# Patient Record
Sex: Male | Born: 1960 | Race: Black or African American | Hispanic: No | Marital: Single | Smoking: Current every day smoker
Health system: Southern US, Community
[De-identification: ages and names within clinical notes are randomized; demographics above are authoritative.]

## PROBLEM LIST (undated history)

## (undated) DIAGNOSIS — E119 Type 2 diabetes mellitus without complications: Secondary | ICD-10-CM

## (undated) DIAGNOSIS — M109 Gout, unspecified: Secondary | ICD-10-CM

## (undated) DIAGNOSIS — M1990 Unspecified osteoarthritis, unspecified site: Secondary | ICD-10-CM

## (undated) DIAGNOSIS — I10 Essential (primary) hypertension: Secondary | ICD-10-CM

---

## 2010-05-05 ENCOUNTER — Emergency Department (HOSPITAL_BASED_OUTPATIENT_CLINIC_OR_DEPARTMENT_OTHER): Admission: EM | Admit: 2010-05-05 | Discharge: 2010-05-05 | Payer: Self-pay | Admitting: Emergency Medicine

## 2010-05-05 ENCOUNTER — Ambulatory Visit: Payer: Self-pay | Admitting: Diagnostic Radiology

## 2012-04-10 IMAGING — CR DG SHOULDER 2+V*L*
3 series · 3 of 3 positions shown · non-contrast
Comparison: None

CLINICAL DATA: Shoulder injury with pain

LEFT SHOULDER - 2+ VIEW

[w shoulder ap internal left]
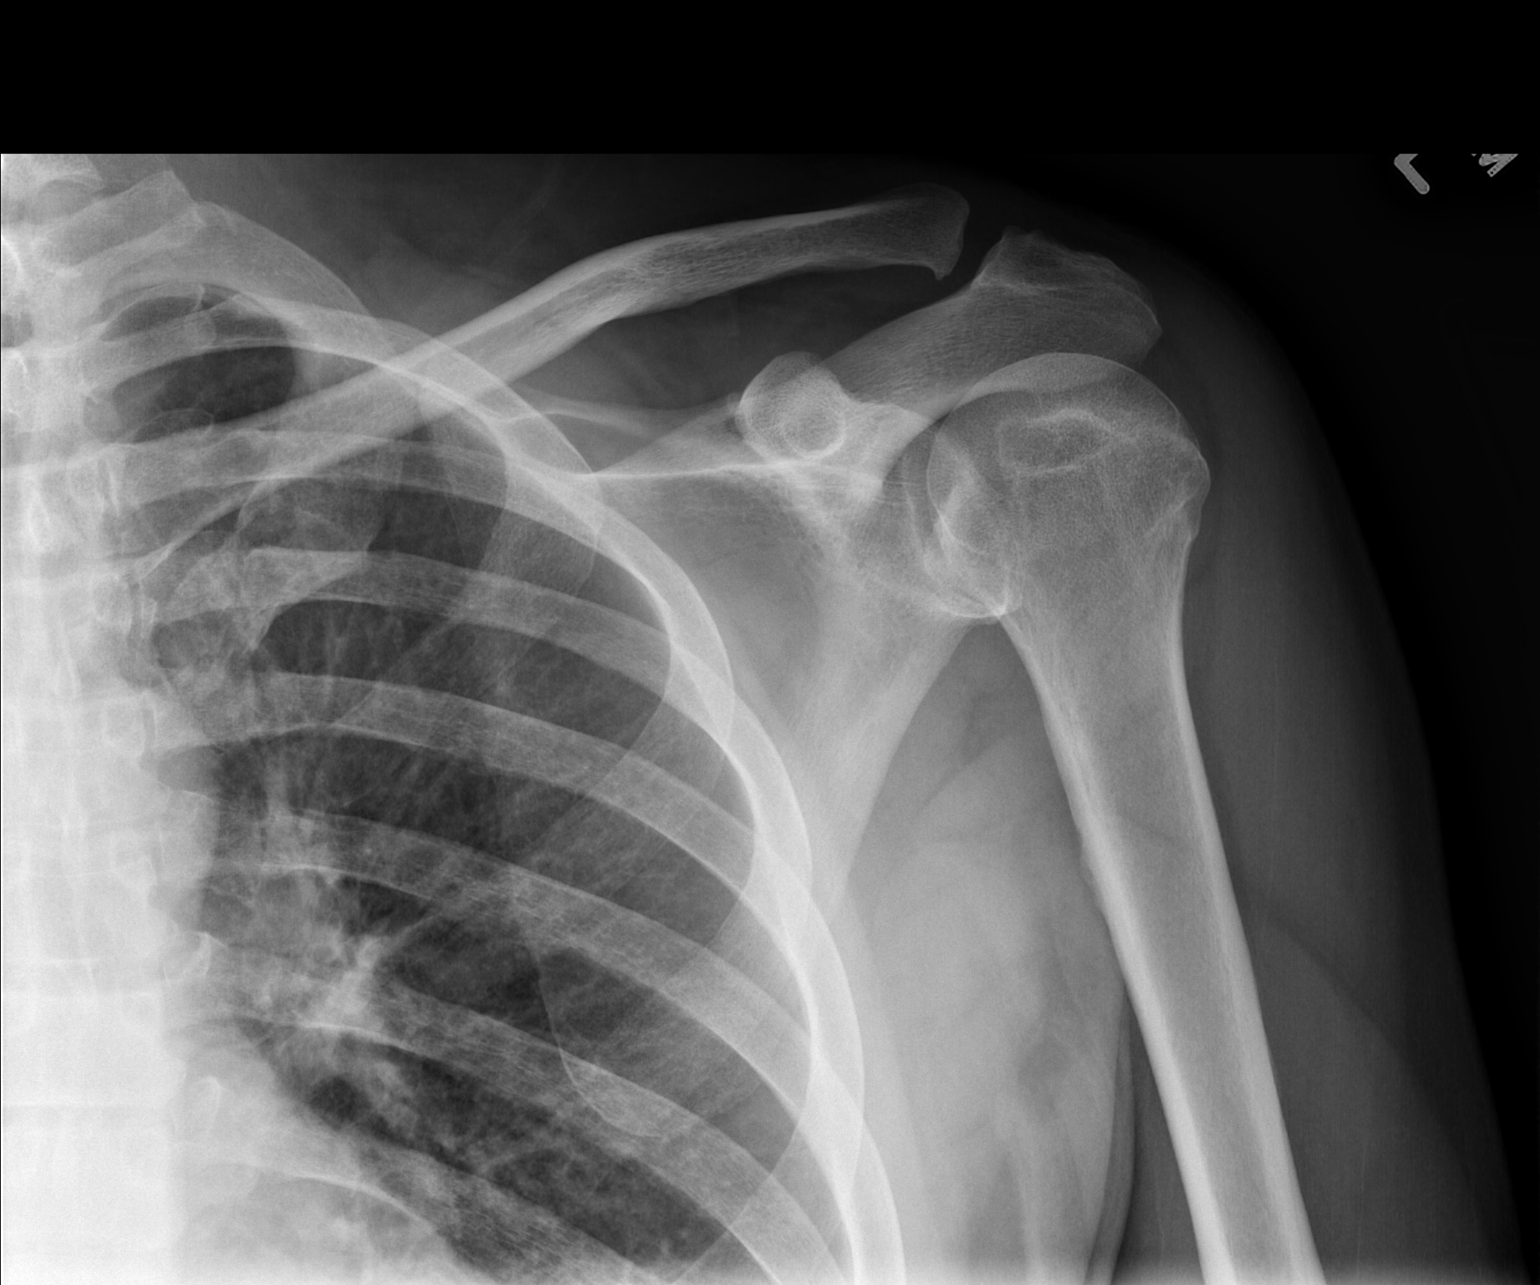

[w shoulder ap external left]
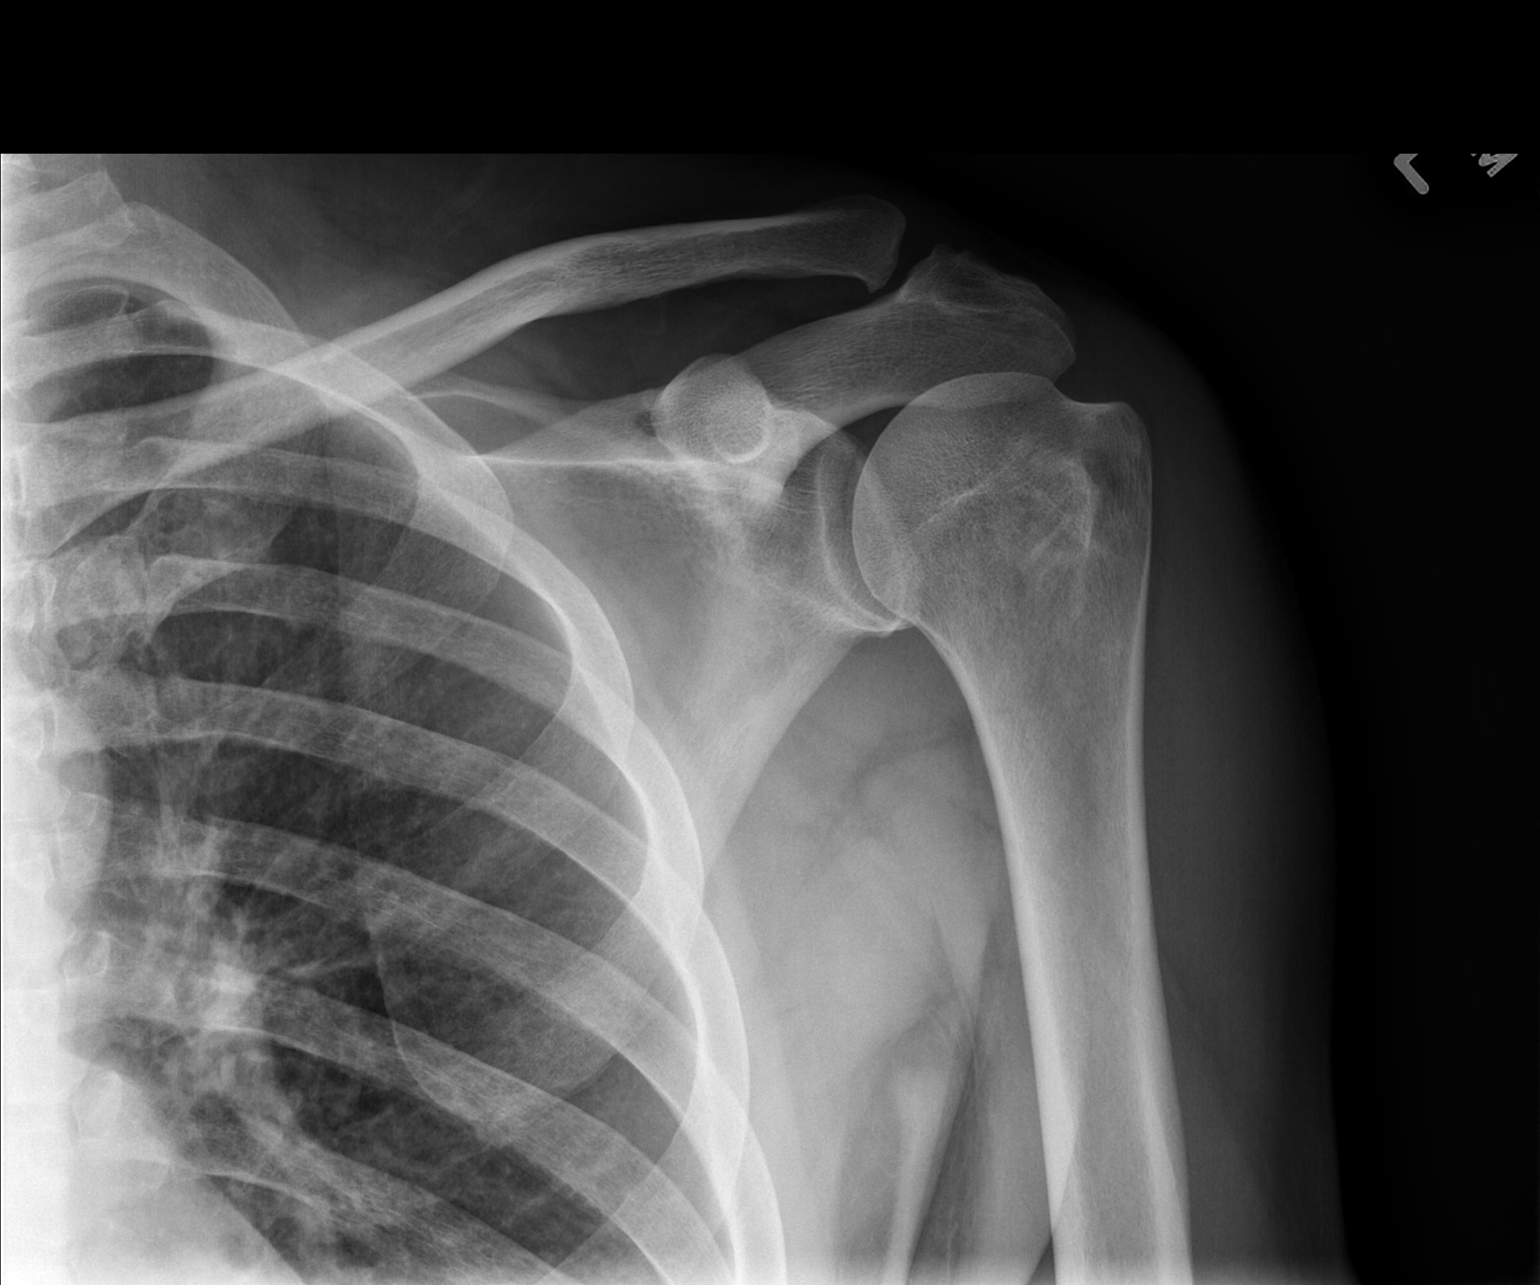

[w shoulder y view left]
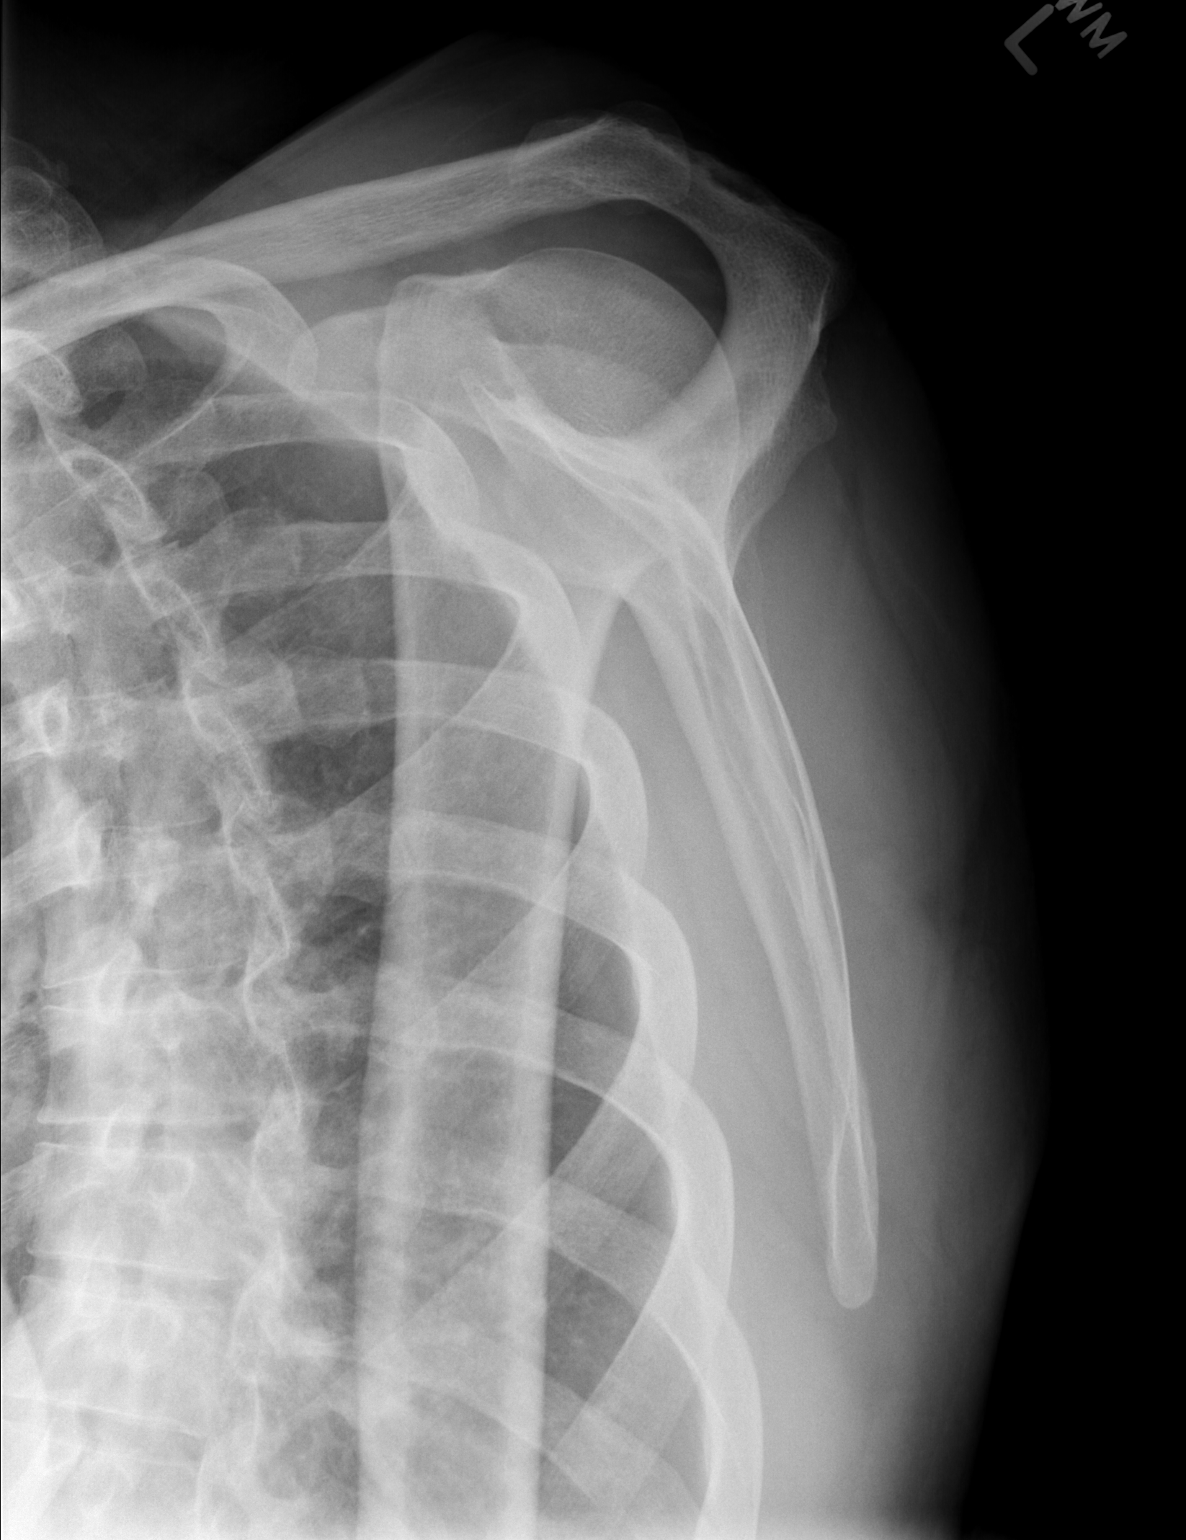

[3 of 3 positions shown; findings below may reference images not displayed]

FINDINGS: Normal alignment.  Negative for fracture.  Mild
degenerative change and spurring in the AC joint.  Glenohumeral
joint appears normal.
IMPRESSION: No acute bony abnormality.

## 2015-08-18 ENCOUNTER — Emergency Department (HOSPITAL_BASED_OUTPATIENT_CLINIC_OR_DEPARTMENT_OTHER)
Admission: EM | Admit: 2015-08-18 | Discharge: 2015-08-18 | Disposition: A | Discharge status: Home / Self Care | Payer: Medicare HMO | Attending: Emergency Medicine | Admitting: Emergency Medicine

## 2015-08-18 ENCOUNTER — Emergency Department (HOSPITAL_BASED_OUTPATIENT_CLINIC_OR_DEPARTMENT_OTHER): Payer: Medicare HMO

## 2015-08-18 ENCOUNTER — Encounter (HOSPITAL_BASED_OUTPATIENT_CLINIC_OR_DEPARTMENT_OTHER): Payer: Self-pay | Admitting: *Deleted

## 2015-08-18 DIAGNOSIS — R112 Nausea with vomiting, unspecified: Secondary | ICD-10-CM | POA: Insufficient documentation

## 2015-08-18 DIAGNOSIS — E119 Type 2 diabetes mellitus without complications: Secondary | ICD-10-CM | POA: Diagnosis not present

## 2015-08-18 DIAGNOSIS — I10 Essential (primary) hypertension: Secondary | ICD-10-CM | POA: Diagnosis not present

## 2015-08-18 DIAGNOSIS — M109 Gout, unspecified: Secondary | ICD-10-CM | POA: Diagnosis not present

## 2015-08-18 DIAGNOSIS — F17210 Nicotine dependence, cigarettes, uncomplicated: Secondary | ICD-10-CM | POA: Insufficient documentation

## 2015-08-18 DIAGNOSIS — G8929 Other chronic pain: Secondary | ICD-10-CM | POA: Diagnosis not present

## 2015-08-18 DIAGNOSIS — R51 Headache: Secondary | ICD-10-CM | POA: Diagnosis present

## 2015-08-18 DIAGNOSIS — M1990 Unspecified osteoarthritis, unspecified site: Secondary | ICD-10-CM | POA: Diagnosis not present

## 2015-08-18 DIAGNOSIS — H53149 Visual discomfort, unspecified: Secondary | ICD-10-CM | POA: Insufficient documentation

## 2015-08-18 DIAGNOSIS — R519 Headache, unspecified: Secondary | ICD-10-CM

## 2015-08-18 DIAGNOSIS — Z79899 Other long term (current) drug therapy: Secondary | ICD-10-CM | POA: Diagnosis not present

## 2015-08-18 HISTORY — DX: Type 2 diabetes mellitus without complications: E11.9

## 2015-08-18 HISTORY — DX: Gout, unspecified: M10.9

## 2015-08-18 HISTORY — DX: Essential (primary) hypertension: I10

## 2015-08-18 HISTORY — DX: Unspecified osteoarthritis, unspecified site: M19.90

## 2015-08-18 LAB — CBC WITH DIFFERENTIAL/PLATELET
Basophils Absolute: 0 10*3/uL (ref 0.0–0.1)
Basophils Relative: 0 %
Eosinophils Absolute: 0 10*3/uL (ref 0.0–0.7)
Eosinophils Relative: 0 %
HEMATOCRIT: 47.2 % (ref 39.0–52.0)
HEMOGLOBIN: 15.7 g/dL (ref 13.0–17.0)
LYMPHS ABS: 1.1 10*3/uL (ref 0.7–4.0)
LYMPHS PCT: 11 %
MCH: 27.7 pg (ref 26.0–34.0)
MCHC: 33.3 g/dL (ref 30.0–36.0)
MCV: 83.4 fL (ref 78.0–100.0)
MONOS PCT: 3 %
Monocytes Absolute: 0.4 10*3/uL (ref 0.1–1.0)
NEUTROS PCT: 86 %
Neutro Abs: 9.3 10*3/uL — ABNORMAL HIGH (ref 1.7–7.7)
Platelets: 183 10*3/uL (ref 150–400)
RBC: 5.66 MIL/uL (ref 4.22–5.81)
RDW: 14.4 % (ref 11.5–15.5)
WBC: 10.8 10*3/uL — ABNORMAL HIGH (ref 4.0–10.5)

## 2015-08-18 LAB — BASIC METABOLIC PANEL
Anion gap: 9 (ref 5–15)
BUN: 9 mg/dL (ref 6–20)
CHLORIDE: 103 mmol/L (ref 101–111)
CO2: 26 mmol/L (ref 22–32)
CREATININE: 1.11 mg/dL (ref 0.61–1.24)
Calcium: 9 mg/dL (ref 8.9–10.3)
GFR calc Af Amer: >60 mL/min (ref >60)
GFR calc non Af Amer: >60 mL/min (ref >60)
GLUCOSE: 165 mg/dL — AB (ref 65–99)
POTASSIUM: 4 mmol/L (ref 3.5–5.1)
Sodium: 138 mmol/L (ref 135–145)

## 2015-08-18 LAB — CBG MONITORING, ED: GLUCOSE-CAPILLARY: 167 mg/dL — AB (ref 65–99)

## 2015-08-18 MED ORDER — SODIUM CHLORIDE 0.9 % IV BOLUS (SEPSIS)
250.0000 mL | Freq: Once | INTRAVENOUS | Status: AC
  Administered 2015-08-18: 250 mL via INTRAVENOUS

## 2015-08-18 MED ORDER — HYDROCODONE-ACETAMINOPHEN 5-325 MG PO TABS: 1.0000 | ORAL_TABLET | Freq: Four times a day (QID) | ORAL | Status: AC | PRN

## 2015-08-18 MED ORDER — ONDANSETRON HCL 4 MG/2ML IJ SOLN
4.0000 mg | Freq: Once | INTRAMUSCULAR | Status: AC
  Administered 2015-08-18: 4 mg via INTRAVENOUS
  Filled 2015-08-18: qty 2

## 2015-08-18 MED ORDER — SODIUM CHLORIDE 0.9 % IV SOLN
INTRAVENOUS | Status: DC
  Administered 2015-08-18: 11:00:00 via INTRAVENOUS

## 2015-08-18 MED ORDER — FENTANYL CITRATE (PF) 100 MCG/2ML IJ SOLN
50.0000 ug | Freq: Once | INTRAMUSCULAR | Status: AC
  Administered 2015-08-18: 50 ug via INTRAVENOUS
  Filled 2015-08-18: qty 2

## 2015-08-18 MED ORDER — HYDROMORPHONE HCL 1 MG/ML IJ SOLN
1.0000 mg | Freq: Once | INTRAMUSCULAR | Status: AC
  Administered 2015-08-18: 1 mg via INTRAVENOUS
  Filled 2015-08-18: qty 1

## 2015-08-18 MED FILL — HYDROCODON-APAP 5-325: 5-325 | 2 days supply | Qty: 10 | Fill #0

## 2015-08-18 NOTE — ED Notes (Signed)
C/o h/a since Friday in top of head and behind right eye. Vomited x 1 today. C/o runny nose and cough with clear sputum.

## 2015-08-18 NOTE — ED Notes (Signed)
Pt is slow to answer questions, speech is normal. PT states he can usually answer questions easier.

## 2015-08-18 NOTE — ED Provider Notes (Signed)
CSN: [id number]     Arrival date & time 08/18/15  4098 History   First MD Initiated Contact with Patient 08/18/15 1020     No chief complaint on file.    (Consider location/radiation/quality/duration/timing/severity/associated sxs/prior Treatment) The history is provided by the patient.   55 year old male with complaint of headache for 5 days. No worse no better. Associated with pain to the top of the head and slight pain behind the right eye. Associated with nausea and vomiting 1 today. No fever. Also associated with slight photophobia. No neck stiffness. No abdominal pain no chest pain no shortness of breath no unusual rash. Patient without history of chronic headaches or headache similar to this. Patient without history of migraines. Patient states she's had a little bit of increased nasal congestion but he felt that was due to the cold temperatures does not feel like he has an upper respiratory infection. Patient does not have any sinus pressure complaints. Patient states that the pain is 6 out of 10.  Past Medical History  Diagnosis Date  . Diabetes mellitus without complication (HCC)   . Hypertension   . Arthritis   . Gout of ankle    History reviewed. No pertinent past surgical history. No family history on file. Social History  Substance Use Topics  . Smoking status: Current Every Day Smoker -- 0.50 packs/day    Types: Cigarettes  . Smokeless tobacco: None  . Alcohol Use: None    Review of Systems  Constitutional: Negative for fever.  HENT: Negative for congestion and sinus pressure.   Eyes: Positive for photophobia and pain. Negative for visual disturbance.  Respiratory: Negative for shortness of breath.   Cardiovascular: Negative for chest pain.  Gastrointestinal: Positive for nausea and vomiting. Negative for abdominal pain.  Genitourinary: Negative for dysuria.  Musculoskeletal: Negative for back pain, neck pain and neck stiffness.  Skin: Negative for rash.   Neurological: Positive for headaches.  Hematological: Does not bruise/bleed easily.  Psychiatric/Behavioral: Negative for confusion.      Allergies  Review of patient's allergies indicates no known allergies.  Home Medications   Prior to Admission medications   Medication Sig Start Date End Date Taking? Authorizing Provider  allopurinol (ZYLOPRIM) 100 MG tablet Take 300 mg by mouth daily.   Yes Historical Provider, MD  lisinopril (PRINIVIL,ZESTRIL) 10 MG tablet Take 10 mg by mouth daily.   Yes Historical Provider, MD  rosuvastatin (CRESTOR) 10 MG tablet Take 10 mg by mouth daily.   Yes Historical Provider, MD  HYDROcodone-acetaminophen (NORCO/VICODIN) 5-325 MG tablet Take 1-2 tablets by mouth every 6 (six) hours as needed for moderate pain. 08/18/15   Vanetta Mulders, MD   BP 138/76 mmHg  Pulse 70  Temp(Src) 98.9 F (37.2 C) (Oral)  Resp 16  Ht 6\' 1"  (1.854 m)  Wt 92.08 kg  BMI 26.79 kg/m2  SpO2 97% Physical Exam  Constitutional: He is oriented to person, place, and time. He appears well-developed and well-nourished. No distress.  HENT:  Head: Normocephalic and atraumatic.  Mouth/Throat: Oropharynx is clear and moist.  Eyes: Conjunctivae and EOM are normal. Pupils are equal, round, and reactive to light.  Neck: Normal range of motion. Neck supple.  Cardiovascular: Normal rate, regular rhythm and normal heart sounds.   No murmur heard. Pulmonary/Chest: Effort normal and breath sounds normal. No respiratory distress.  Abdominal: Soft. Bowel sounds are normal. There is no tenderness.  Musculoskeletal: Normal range of motion.  Neurological: He is alert and oriented to person, place,  and time. No cranial nerve deficit. He exhibits normal muscle tone. Coordination normal.  Skin: Skin is warm. No rash noted.  Nursing note and vitals reviewed.   ED Course  Procedures (including critical care time) Labs Review Labs Reviewed  CBC WITH DIFFERENTIAL/PLATELET - Abnormal; Notable  for the following:    WBC 10.8 (*)    Neutro Abs 9.3 (*)    All other components within normal limits  BASIC METABOLIC PANEL - Abnormal; Notable for the following:    Glucose, Bld 165 (*)    All other components within normal limits  CBG MONITORING, ED - Abnormal; Notable for the following:    Glucose-Capillary 167 (*)    All other components within normal limits    Imaging Review Ct Head Wo Contrast  08/18/2015  CLINICAL DATA:  Headache since Friday.  Smoker. EXAM: CT HEAD WITHOUT CONTRAST TECHNIQUE: Contiguous axial images were obtained from the base of the skull through the vertex without intravenous contrast. COMPARISON:  None. FINDINGS: There is no evidence of mass effect, midline shift, or extra-axial fluid collections. There is no evidence of a space-occupying lesion or intracranial hemorrhage. There is no evidence of a cortical-based area of acute infarction. 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 There is generalized cerebral atrophy. There is periventricular white matter low attenuation likely secondary to microangiopathy. The ventricles and sulci are appropriate for the patient's age. The basal cisterns are patent. Visualized portions of the orbits are unremarkable. The visualized portions of the paranasal sinuses and mastoid air cells are unremarkable. The osseous structures are unremarkable. IMPRESSION: No acute intracranial pathology. Electronically Signed   By: Elige KoHetal  Patel   On: 08/18/2015 10:52   I have personally reviewed and evaluated these images and lab results as part of my medical decision-making.   EKG Interpretation None      MDM   Final diagnoses:  Acute intractable headache, unspecified headache type    Lab workup without any acute findings. Head CT negative. No evidence of any sinus infection. No evidence of any head bleed. Patient's headache improved here with pain medicine. If symptoms persist MRI of brain would be appropriate. Patient  currently does not have a primary care doctor resource guide provided to help him find a primary care provider. Patient had only slight pain behind the right eye no actual eyeball pain. Do not think this is consistent with acute glaucoma. Patient really without upper respiratory infection symptoms. Several low bit more of a runny nose but he felt that that was do to the cold temperatures with it having of late and not anything terribly unusual.  Patient without history of chronic headaches or migraines. Clinically do not feel is consistent with meningitis.  Vanetta MuldersScott Enio Hornback, MD 08/18/15 1315

## 2015-08-18 NOTE — ED Notes (Signed)
MD at bedside. 

## 2015-08-18 NOTE — ED Notes (Signed)
Nurse first witnessed pt leave ed with another adult male who was driving.

## 2015-08-18 NOTE — ED Notes (Signed)
Pt states his brother is coming to pick him up, but he does not know when he will be here. Pt asked to call again, states he is getting only a busy signal.

## 2015-08-18 NOTE — Discharge Instructions (Signed)
General Headache Without Cause A headache is pain or discomfort felt around the head or neck area. There are many causes and types of headaches. In some cases, the cause may not be found.  HOME CARE  Managing Pain  Take over-the-counter and prescription medicines only as told by your doctor.  Lie down in a dark, quiet room when you have a headache.  If directed, apply ice to the head and neck area:  Put ice in a plastic bag.  Place a towel between your skin and the bag.  Leave the ice on for 20 minutes, 2-3 times per day.  Use a heating pad or hot shower to apply heat to the head and neck area as told by your doctor.  Keep lights dim if bright lights bother you or make your headaches worse. Eating and Drinking  Eat meals on a regular schedule.  Lessen how much alcohol you drink.  Lessen how much caffeine you drink, or stop drinking caffeine. General Instructions  Keep all follow-up visits as told by your doctor. This is important.  Keep a journal to find out if certain things bring on headaches. For example, write down:  What you eat and drink.  How much sleep you get.  Any change to your diet or medicines.  Relax by getting a massage or doing other relaxing activities.  Lessen stress.  Sit up straight. Do not tighten (tense) your muscles.  Do not use tobacco products. This includes cigarettes, chewing tobacco, or e-cigarettes. If you need help quitting, ask your doctor.  Exercise regularly as told by your doctor.  Get enough sleep. This often means 7-9 hours of sleep. GET HELP IF:  Your symptoms are not helped by medicine.  You have a headache that feels different than the other headaches.  You feel sick to your stomach (nauseous) or you throw up (vomit).  You have a fever. GET HELP RIGHT AWAY IF:   Your headache becomes really bad.  You keep throwing up.  You have a stiff neck.  You have trouble seeing.  You have trouble speaking.  You have  pain in the eye or ear.  Your muscles are weak or you lose muscle control.  You lose your balance or have trouble walking.  You feel like you will pass out (faint) or you pass out.  You have confusion.   This information is not intended to replace advice given to you by your health care provider. Make sure you discuss any questions you have with your health care provider.  Resource guide provided below. If headache persists would recommend MRI of the brain for further evaluation. Today's evaluation without any acute findings. Take pain medicine as directed.        Emergency Department Resource Guide 1) Find a Doctor and Pay Out of Pocket Although you won't have to find out who is covered by your insurance plan, it is a good idea to ask around and get recommendations. You will then need to call the office and see if the doctor you have chosen will accept you as a new patient and what types of options they offer for patients who are self-pay. Some doctors offer discounts or will set up payment plans for their patients who do not have insurance, but you will need to ask so you aren't surprised when you get to your appointment.  2) Contact Your Local Health Department Not all health departments have doctors that can see patients for sick visits, but many  do, so it is worth a call to see if yours does. If you don't know where your local health department is, you can check in your phone book. The CDC also has a tool to help you locate your state's health department, and many state websites also have listings of all of their local health departments.  3) Find a Walk-in Clinic If your illness is not likely to be very severe or complicated, you may want to try a walk in clinic. These are popping up all over the country in pharmacies, drugstores, and shopping centers. They're usually staffed by nurse practitioners or physician assistants that have been trained to treat common illnesses and  complaints. They're usually fairly quick and inexpensive. However, if you have serious medical issues or chronic medical problems, these are probably not your best option.  No Primary Care Doctor: - Call Health Connect at  [phone] - they can help you locate a primary care doctor that  accepts your insurance, provides certain services, etc. - Physician Referral Service- [phone]  Chronic Pain Problems: Organization         Address  Phone   Notes  [facility]  [phone] Patients need to be referred by their primary care doctor.   Medication Assistance: Organization         Address  Phone   Notes  [facility] Medication [facility] [address] [place], [place] [address] [phone] --Must be a resident of [facility] -- Must have NO insurance coverage whatsoever (no Medicaid/ Medicare, etc.) -- The pt. MUST have a primary care doctor that directs their care regularly and follows them in the community   [facility]ist  [phone]   [employer]  [phone]    Agencies that provide inexpensive medical care: Organization         Address  Phone   Notes  [facility]  [phone]   [facility]    [phone]   [facility] [address] [place], [place] [address] [phone]   [facility] [address] [place] [place] [phone]   Planned Parenthood    [phone]   [facility]    [phone]   [facility]  [address], [place] Phone:  [phone], Fax:  [phone] Hours of Operation:  9 am - 6 pm, M-F.  Also accepts Medicaid/Medicare and self-pay.  [facility]  [address], [place] Phone: [phone], Fax: [phone]. Hours of Operation:  8:30 am - 5:30 pm, M-F.  Also accepts Medicaid and self-pay.  [facility] [place] [address], [place] Phone: [phone]   [facility] [address] Natasha Bence [place], [place] [phone], Ext. [phone] Mondays & Thursdays: 7-9 AM.  First 15 patients are seen on a first come, first serve basis.    Medicaid-accepting [facility] Providers:  Organization         Address  Phone   Notes  [facility] [address], [place] [phone] Also accepts self-pay patients.  [facility] [address] Laurell Josephs [place], [place]  [phone]   [facility] [address], [place] [phone]   [facility] [address] [place] [phone]   Renaye Rakers  [address], [place]   [phone] Only accepts [place] patients after they have their name applied to their card.   Self-Pay (no insurance) in [facility]:  Organization         Address  Phone   Notes  Sickle Cell Patients, [facility] [address] [place], [place] [phone]   [facility]gent Care [address] [place], [place] [phone]   [facility]  [address], [place] [phone]   [facility]/Dr. Osei-Bonsu  [address] [place] or [address], [place] [phone] Phone number for both [place] and [place] locations is the same.  [facility] [address], [place] [phone]   [facility] [address] [place] or [address] [phone]   [facility] [address], [place] [phone], phone; [phone], fax Sees patients 1st and 3rd Saturday of every month.  Must not qualify for public or private insurance (i.e. Medicaid, Medicare, [place] Health Choice, Veterans' Benefits)  Household income should be no more than 200% of the poverty level  The clinic cannot treat you if you are pregnant or think you are pregnant  Sexually transmitted diseases are not treated at the clinic.    Dental Care: Organization         Address  Phone  Notes  [facility] Department of [facility] [address] [place], [place] [phone] Accepts children up to age 54 who are enrolled in [place] or [place] Health Choice; pregnant women with a Medicaid card; and children who have applied for Medicaid or [place] Health Choice, but were declined, whose parents can pay a reduced fee at time of service.  [facility] Department of [facility]  [address], [place] [phone] Accepts children up to age 27 who are enrolled in [place] or [place] Health Choice; pregnant women with a Medicaid card; and children who have applied for Medicaid or [place] Health Choice, but were declined, whose parents can pay a reduced fee at time of service.  [facility]  [address] [place], [place] [phone] Patients are seen by appointment only. Walk-ins are not accepted. [facility] will see patients 81 years of age and older. Monday - Tuesday (8am-5pm) Most Wednesdays (8:30-5pm) $30 per visit, cash only  [facility] Adult Dental Access [facility]  [address], [facility] [phone] Patients are seen by appointment only. Walk-ins are not accepted. [facility] will see patients 25 years of age and older. One Wednesday Evening (Monthly: Volunteer Based).  $30 per visit, cash only  [employer]  [phone] for adults; Children under age 74, call Graduate Pediatric Dentistry at [phone]. Children aged 74-14, please call [phone] to request a pediatric application.  Dental services are provided in all areas of dental care including fillings, crowns and bridges, complete and partial dentures, implants, gum treatment, root canals, and extractions. Preventive care is  also provided. Treatment is provided to both adults and children. Patients are selected via a lottery and there is often a waiting list.   [facility] [address], [place]  [phone] [email or web address]   [facility] [address] [place], [place] [phone], Ext. [phone] Second  and Fourth Thursday of each month, opens at 6:30 AM; Clinic ends at 9 AM.  Patients are seen on a first-come first-served basis, and a limited number are seen during each clinic.   [facility]  [address] Ether Griffins [place], [place] [phone]   Eligibility Requirements You must have lived in [place], [place], or [place] counties for at least the last three months.   You cannot be eligible for state or federal sponsored [place], including [employer], [place], or [employer].   You generally cannot be eligible for healthcare insurance through your employer.    How to apply: Eligibility screenings are held every Tuesday and Wednesday afternoon from 1:00 pm until 4:00 pm. You do not need an appointment for the interview!  [facility] [address] [place], [place] [phone]   [facility] Health Department  [phone]   [facility] Health Department  [phone]   [facility] Health Department  [phone]    Behavioral Health Resources in the Community: Intensive Outpatient Programs Organization         Address  Phone  Notes  [facility] Services [address] [place], [place] [phone]   [facility] [address], [place], [place] [phone]   [facility] [address], [place], [place]  [phone]   [facility] Mental Health [address]  [place], [place] [phone] or [phone]   Substance Abuse Resources Organization         Address  Phone  Notes  [facility]  [phone]   [facility]  [phone]   [facility] [place]  [phone]   Floydene Flock  [phone]   Residential & Outpatient Substance Abuse Program  [phone]   Psychological Services Organization         Address  Phone  Notes  [facility]  [phone]   [facility] Services  [phone]   [facility] Mental Health [address], [place] [phone] or [phone]    Mobile Crisis Teams Organization         Address  Phone  Notes  [facility], Mobile Crisis Care Unit  [phone]   [facility]  [address] [place], [place] [phone]   Doristine Locks [address] [place] [phone]    Self-Help/Support Groups Organization         Address  Phone             Notes  [facility] [place] - variety of support groups  [phone]- I7437963 Call for more information  Narcotics Anonymous (NA), [facility] [address], [employer] [place]  2 meetings at this location   Statistician         Address  Phone  Notes  [facility] [address],    [place]  [phone]   [facility]  [address], [place] [address], [place], [place] [phone]   [facility] [address] [place], [place] [phone] Admissions: 8am-3pm M-F  [facility] [address],    [place], [place] [phone]   [facility] [address] Starling Manns [place], [place] [phone]   [facility] [address],  [place], [place] [phone]   [facility] - Intensive Outpatient [address]ure[address], [place], [place] [phone]   [facility] ([facility] [phone] [address]  [place], [place] [phone] or [phone]   [facility] ([facility]) [address] [place], [place] [phone] Accepts Medicaid  [facility] [address]  [place] [phone]  Substance Abuse/Addiction Treatment   [facility] Behavioral Health Resources Organization         Address  Phone  Notes  [facility]  [phone]) [phone]   Angie FavaJulie Brannon, PhD [address] [place], [place]   [phone](336) [phone] or [phone]) [phone]   [facility]   [address] [place], [place] [phone](336) [phone]   [facility] [address], [place], [place] [phone]) [phone] Insurance/Medicaid/sponsorship through [facility]  [facility] [address]                                    [place], [place] [phone]) [phone] Therapy/tele-psych/case  [facility] [address] [place].   [place], [place] [phone]) [phone]    Dr. Lolly MustacheArfeen  [phone]) [phone]   [facility]  [employer] [facility] 1) [address], [place] 2) [address], [place] 3)  [address], [place] [phone]) [phone](336) [phone]  [phone](336) [phone]   [facility] Child Abuse Hotline [phone](336) [phone] or [phone](336) [phone] (After Hours)         Document Released: 05/02/2008 Document Revised: 04/14/2015 Document Reviewed: 11/16/2014 Elsevier Interactive Patient Education [employer]vier Inc.

## 2017-07-24 IMAGING — CT CT HEAD W/O CM
1 series · 16 of 30 positions shown, 20 images · non-contrast
Comparison: None.

CLINICAL DATA: Headache since [REDACTED].  Smoker.

EXAM:
CT HEAD WITHOUT CONTRAST
TECHNIQUE: Contiguous axial images were obtained from the base of the skull
through the vertex without intravenous contrast.

[Series 2: head wo · axial · 0.45mm/px · z∈[-120,+20]mm · 16 of 33 slices shown, 20 images]
[im 2/33  brain]
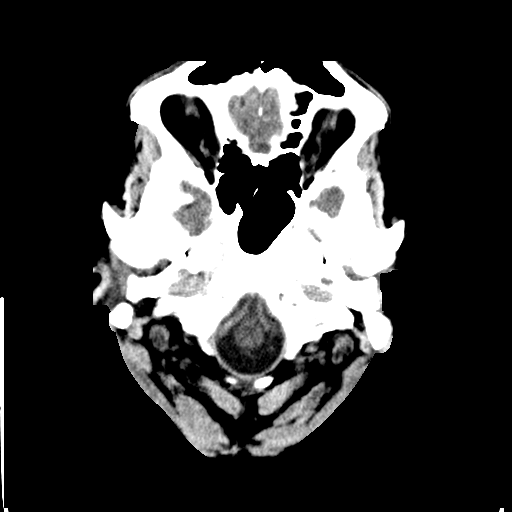
[im 2/33  bone]
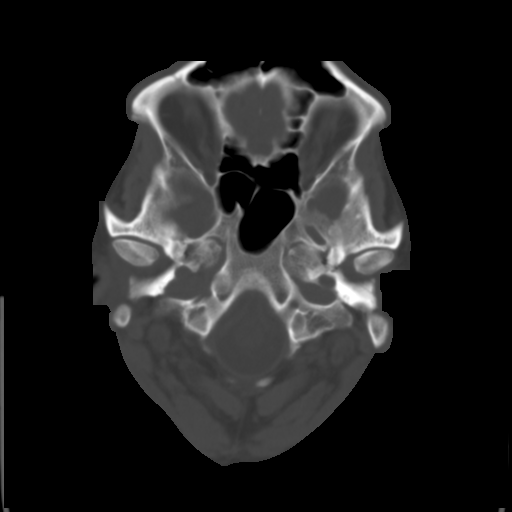
[im 4/33  brain]
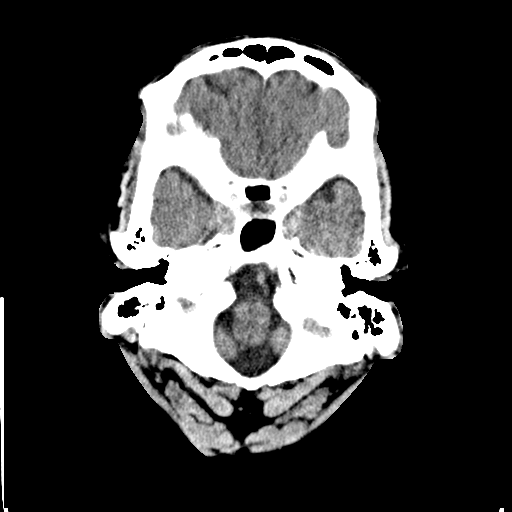
[im 6/33  brain]
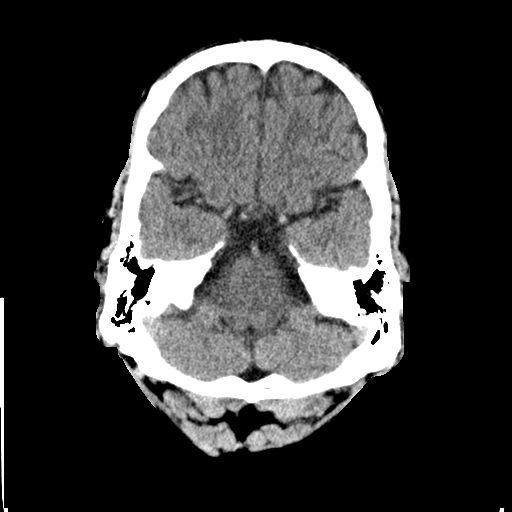
[im 8/33  brain]
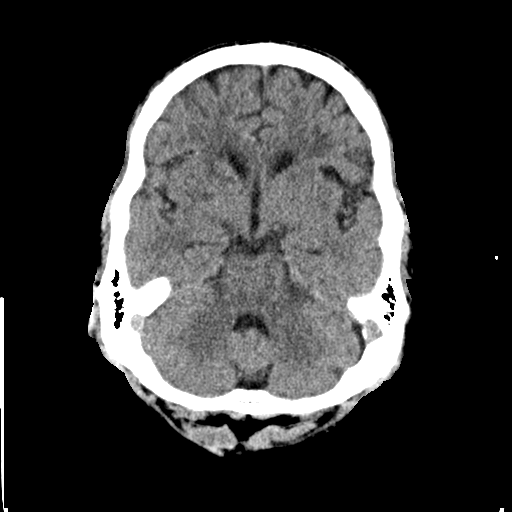
[im 9/33  brain]
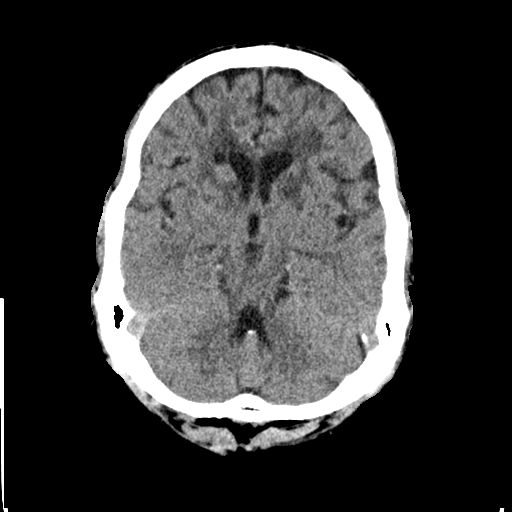
[im 9/33  bone]
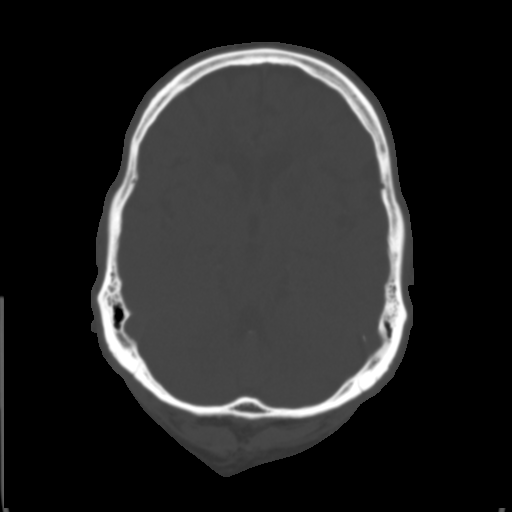
[im 12/33  brain]
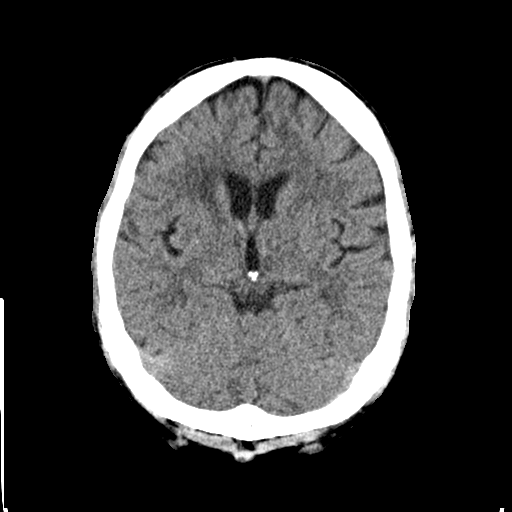
[im 14/33  brain]
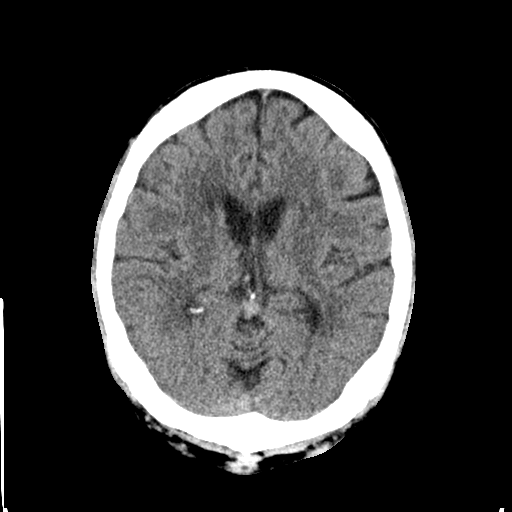
[im 16/33  brain]
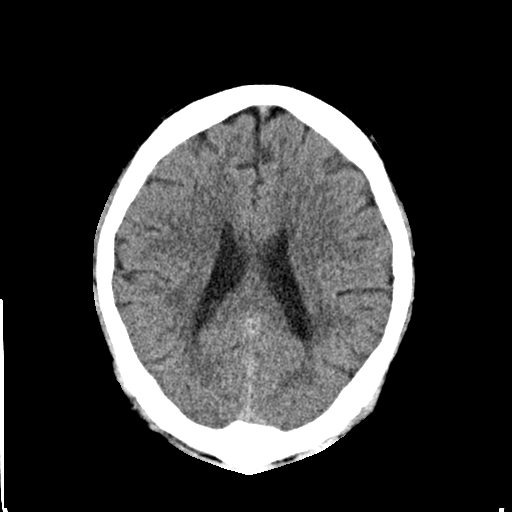
[im 17/33  brain]
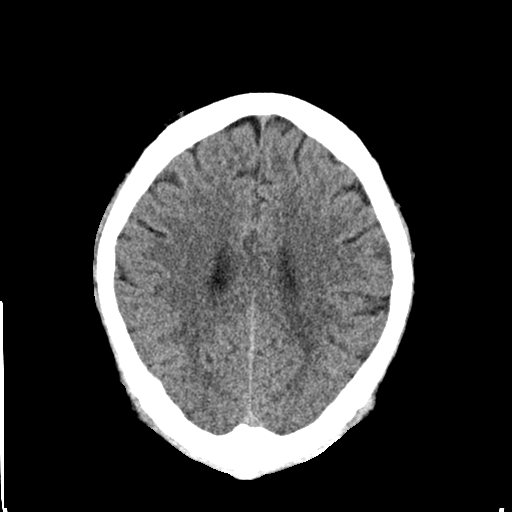
[im 17/33  bone]
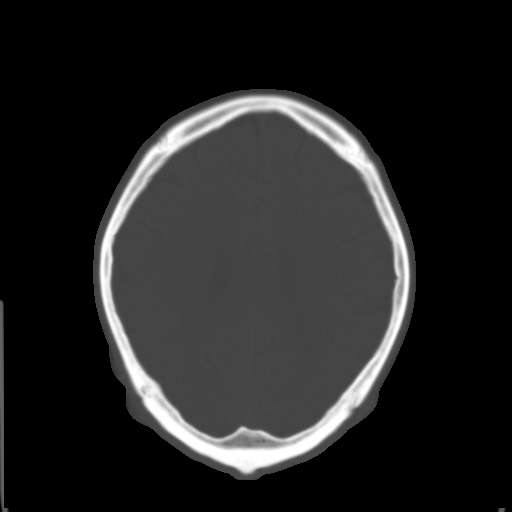
[im 19/33  brain]
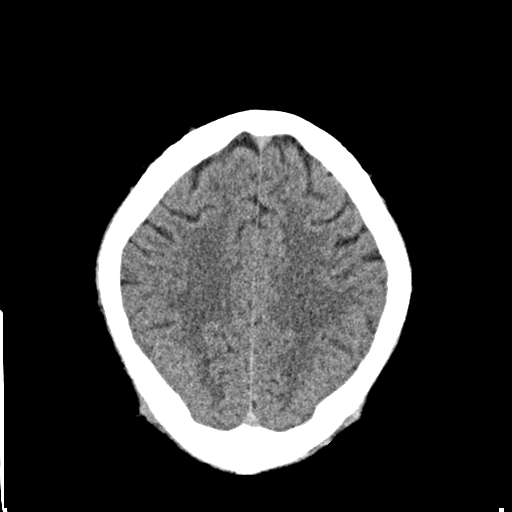
[im 21/33  brain]
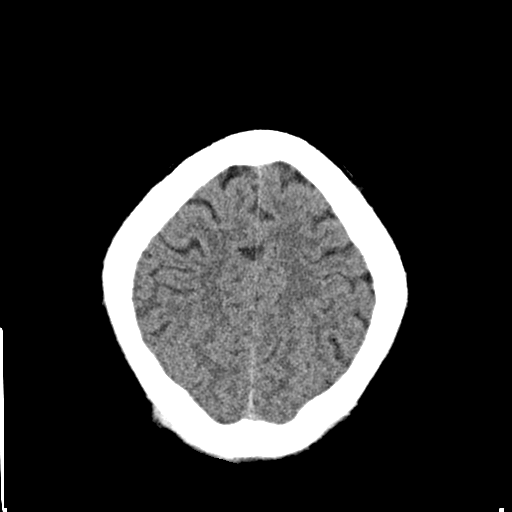
[im 24/33  brain]
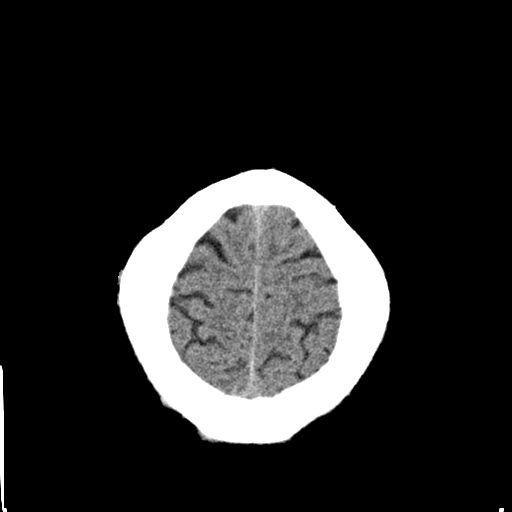
[im 25/33  brain]
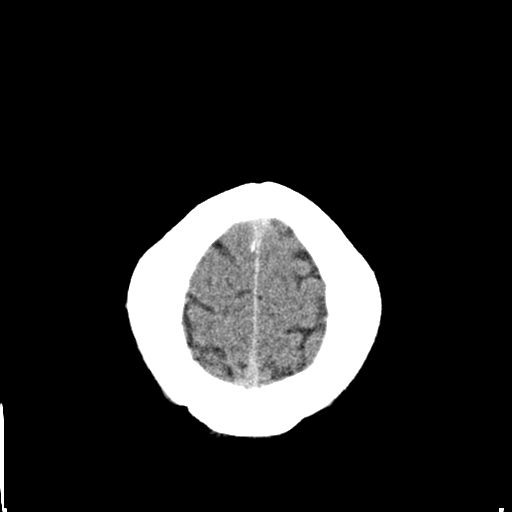
[im 25/33  bone]
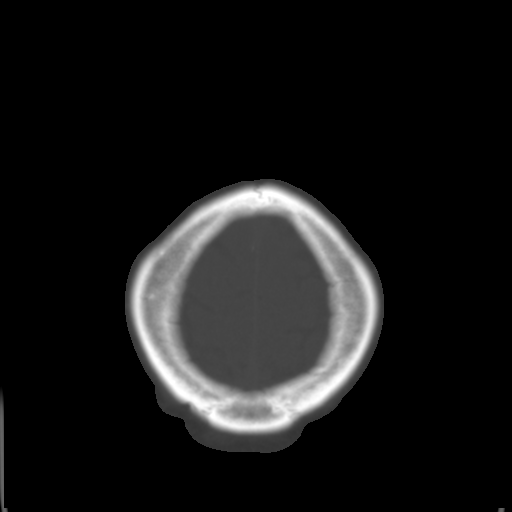
[im 27/33  brain]
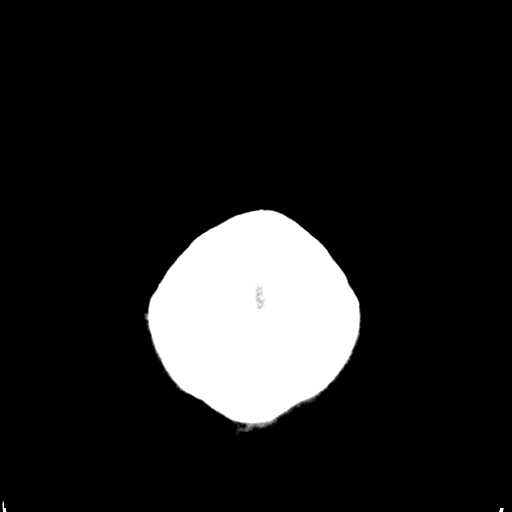
[im 29/33  brain]
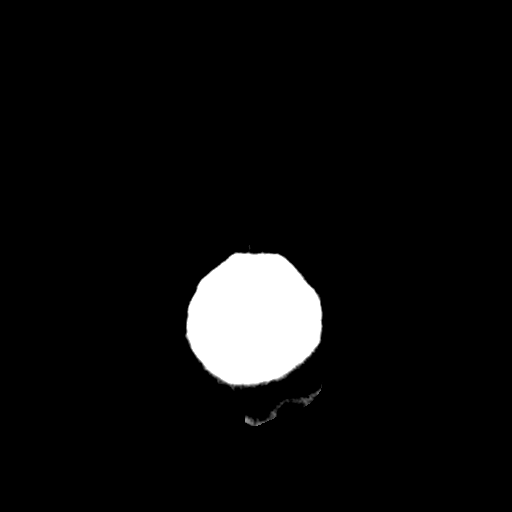
[im 31/33  brain]
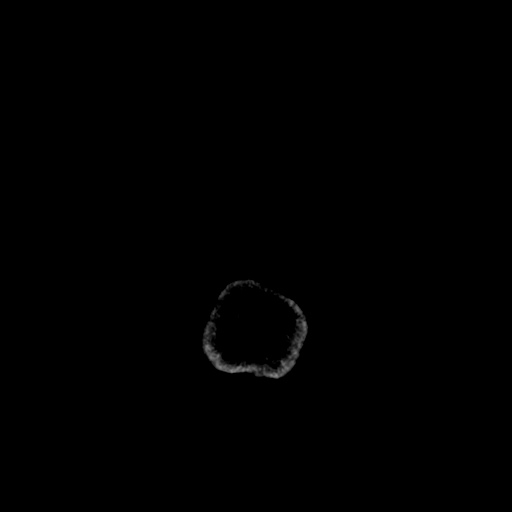

[16 of 30 positions shown; findings below may reference images not displayed]

FINDINGS: There is no evidence of mass effect, midline shift, or extra-axial
fluid collections. There is no evidence of a space-occupying lesion
or intracranial hemorrhage. There is no evidence of a cortical-based
area of acute infarction. There are bilateral old basal ganglia
lacunar infarcts. There is generalized cerebral atrophy. There is
periventricular white matter low attenuation likely secondary to
microangiopathy.

The ventricles and sulci are appropriate for the patient's age. The
basal cisterns are patent.

Visualized portions of the orbits are unremarkable. The visualized
portions of the paranasal sinuses and mastoid air cells are
unremarkable.

The osseous structures are unremarkable.
IMPRESSION: No acute intracranial pathology.
